# Patient Record
Sex: Female | Born: 1945 | Race: Black or African American | Hispanic: No | Smoking: Never smoker
Health system: Southern US, Community
[De-identification: ages and names within clinical notes are randomized; demographics above are authoritative.]

## PROBLEM LIST (undated history)

## (undated) DIAGNOSIS — I10 Essential (primary) hypertension: Secondary | ICD-10-CM

## (undated) DIAGNOSIS — A809 Acute poliomyelitis, unspecified: Secondary | ICD-10-CM

## (undated) DIAGNOSIS — H547 Unspecified visual loss: Secondary | ICD-10-CM

## (undated) HISTORY — PX: EYE SURGERY: SHX253

---

## 1998-01-10 ENCOUNTER — Other Ambulatory Visit: Admission: RE | Admit: 1998-01-10 | Discharge: 1998-01-10 | Payer: Self-pay | Admitting: Endocrinology

## 1999-01-15 ENCOUNTER — Other Ambulatory Visit: Admission: RE | Admit: 1999-01-15 | Discharge: 1999-01-15 | Payer: Self-pay | Admitting: Endocrinology

## 2000-02-12 ENCOUNTER — Other Ambulatory Visit: Admission: RE | Admit: 2000-02-12 | Discharge: 2000-02-12 | Payer: Self-pay | Admitting: Endocrinology

## 2001-02-23 ENCOUNTER — Other Ambulatory Visit: Admission: RE | Admit: 2001-02-23 | Discharge: 2001-02-23 | Payer: Self-pay | Admitting: Endocrinology

## 2001-03-09 ENCOUNTER — Encounter: Payer: Self-pay | Admitting: Endocrinology

## 2001-03-09 ENCOUNTER — Ambulatory Visit (HOSPITAL_COMMUNITY): Admission: RE | Admit: 2001-03-09 | Discharge: 2001-03-09 | Payer: Self-pay | Admitting: Endocrinology

## 2002-08-19 ENCOUNTER — Other Ambulatory Visit: Admission: RE | Admit: 2002-08-19 | Discharge: 2002-08-19 | Payer: Self-pay | Admitting: Endocrinology

## 2004-08-31 ENCOUNTER — Other Ambulatory Visit: Admission: RE | Admit: 2004-08-31 | Discharge: 2004-08-31 | Payer: Self-pay | Admitting: Endocrinology

## 2004-11-14 ENCOUNTER — Encounter (INDEPENDENT_AMBULATORY_CARE_PROVIDER_SITE_OTHER): Payer: Self-pay | Admitting: Specialist

## 2004-11-14 ENCOUNTER — Ambulatory Visit (HOSPITAL_COMMUNITY): Admission: RE | Admit: 2004-11-14 | Discharge: 2004-11-14 | Payer: Self-pay | Admitting: *Deleted

## 2005-01-08 ENCOUNTER — Ambulatory Visit (HOSPITAL_COMMUNITY): Admission: RE | Admit: 2005-01-08 | Discharge: 2005-01-08 | Payer: Self-pay | Admitting: Endocrinology

## 2006-02-06 ENCOUNTER — Ambulatory Visit (HOSPITAL_COMMUNITY): Admission: RE | Admit: 2006-02-06 | Discharge: 2006-02-06 | Payer: Self-pay | Admitting: Endocrinology

## 2006-11-19 ENCOUNTER — Other Ambulatory Visit: Admission: RE | Admit: 2006-11-19 | Discharge: 2006-11-19 | Payer: Self-pay | Admitting: Endocrinology

## 2007-04-20 ENCOUNTER — Ambulatory Visit (HOSPITAL_COMMUNITY): Admission: RE | Admit: 2007-04-20 | Discharge: 2007-04-20 | Payer: Self-pay | Admitting: *Deleted

## 2007-04-20 ENCOUNTER — Encounter (INDEPENDENT_AMBULATORY_CARE_PROVIDER_SITE_OTHER): Payer: Self-pay | Admitting: *Deleted

## 2008-02-09 ENCOUNTER — Other Ambulatory Visit: Admission: RE | Admit: 2008-02-09 | Discharge: 2008-02-09 | Payer: Self-pay | Admitting: Endocrinology

## 2009-04-03 ENCOUNTER — Ambulatory Visit (HOSPITAL_COMMUNITY): Admission: RE | Admit: 2009-04-03 | Discharge: 2009-04-03 | Payer: Self-pay | Admitting: Endocrinology

## 2010-07-10 NOTE — Op Note (Signed)
[name], Jaclyn Donovan               ACCOUNT NO.:  [id number]   MEDICAL RECORD NO.:  [id number]          PATIENT TYPE:  AMB   LOCATION:  ENDO                         FACILITY:  [facility]   PHYSICIAN:  Georgiana Spinner, M.D.    DATE OF BIRTH:  29-Jan-1946   DATE OF PROCEDURE:  DATE OF DISCHARGE:                               OPERATIVE REPORT   PROCEDURE:  Colonoscopy with polypectomy.   INDICATIONS:  Colon polyp.   ANESTHESIA:  Fentanyl 100 mcg, Versed 9 mg.   PROCEDURE:  With the patient mildly sedated in the left lateral  decubitus position, the Pentax videoscopic colonoscope was inserted in  the rectum and passed under direct vision with pressure applied. The  patient turned to her back and subsequently pulled back the colonoscope  and then reinserted. We subsequently were able to reach the cecum  identified by the crow's foot of the cecum and ileocecal valve both of  which were photographed. From this point, the colonoscope was slowly  withdrawn taking circumferential views of the colonic mucosa stopping  only in the ascending colon where a small polyp was seen, photographed  and removed using snare cautery technique setting of 20/150 blended  current.  Tissue was retained by suctioning through the endoscope.  The  endoscope was then withdrawn all the way to the rectum which appeared  normal on direct and showed hemorrhoids on retroflexed view. The  endoscope was straightened and withdrawn.  The patient's vital signs and  pulse oximeter remained stable.  The patient tolerated the procedure  well without apparent complications.   FINDINGS:  Polyp of ascending colon removed using snare cautery  technique.   PLAN:  Await biopsy report. The patient will call me for results and  follow-up with me as needed as an outpatient.  This was a difficult  exam.           ______________________________  Georgiana Spinner, M.D.     GMO/MEDQ  D:  04/20/2007  T:  04/20/2007  Job:  [id number]

## 2010-07-13 NOTE — Op Note (Signed)
Jaclyn Donovan, Jaclyn Donovan               ACCOUNT NO.:  [id number]   MEDICAL RECORD NO.:  [id number]          PATIENT TYPE:  AMB   LOCATION:  ENDO                         FACILITY:  [facility]   PHYSICIAN:  Georgiana Spinner, M.D.    DATE OF BIRTH:  [birth year]   DATE OF PROCEDURE:  DATE OF DISCHARGE:                                 OPERATIVE REPORT   PROCEDURE:  Colonoscopy with polypectomy.   INDICATIONS:  Colon polyp.   ANESTHESIA:  Demerol 20, Versed 1 mg.   PROCEDURE:  With the patient mildly sedated in the left lateral decubitus  position, the Olympus videoscopic colonoscope was inserted into the rectum  and passed under direct vision to the cecum, identified by the ileocecal  valve and the appendiceal orifice, both of which were photographed.  From  this point, the colonoscope was slowly withdrawn, taking circumferential  views of the colonic mucosa, stopping only at approximately 40 cm from the  anal verge, at which point a polyp was seen, photographed, and removed using  snare cautery technique.  At a setting of 20/200 blended current, tissue was  retrieved by suctioning to the end of the endoscope and withdrawing it.  The  endoscope was then reinserted to this level and withdrawn, taking  circumferential views at the remaining colonic mucosa, stopping only in the  rectum which appeared normal on direct and retroflexed view.  The scope was  straightened and withdrawn.  Patient's vital signs and pulse oximetry  remained stable.  Patient tolerated the procedure well without apparent  complications.   FINDINGS:  Polyp on a short stalk, removed by snare cautery technique,  otherwise an unremarkable colonoscopic examination.   PLAN:  Await biopsy report.  Patient will call me for results and follow up  with me as an outpatient.   FINDINGS:  Internal hemorrhoids, otherwise unremarkable examination.   PLAN:  See endoscopy note for further details.            ______________________________  Georgiana Spinner, M.D.     GMO/MEDQ  D:  11/14/2004  T:  11/14/2004  Job:  [id number]

## 2010-07-13 NOTE — Op Note (Signed)
NAMEBOBBYJO, Jaclyn Donovan               ACCOUNT NO.:  [id number]   MEDICAL RECORD NO.:  [id number]          PATIENT TYPE:  AMB   LOCATION:  ENDO                         [facility]ACILITY[facility]   PHYSICIAN:  Georgiana Spinner, M.D.    DATE OF BIRTH:  05/03/1945   DATE OF PROCEDURE:  11/14/2004  DATE OF DISCHARGE:                                 OPERATIVE REPORT   PROCEDURE:  Upper endoscopy.   INDICATIONS:  Gastroesophageal reflux disease.   ANESTHESIA:  Demerol 60 milligrams, Versed 6 milligrams.   DESCRIPTION OF PROCEDURE:  With the patient mildly sedated in the left  lateral decubitus position, the Olympus videoscopic endoscope was inserted  in the mouth, passed under direct vision through the esophagus which  appeared normal until we reached the distal esophagus and the patient was at  this point slightly light under sedation, gagged, and I could not really  tell if the squamocolumnar junction showed any areas of Barrett's because  she continued to gag, but I did photograph this area and take some sample  biopsies but I would probably want to look again if these are negative. We  then advanced into the stomach. The fundus, body, antrum, duodenal bulb, and  second portion of the duodenum were visualized. From this point, the  endoscope was slowly withdrawn taking circumferential views of the duodenal  mucosa until the endoscope had been pulled back into stomach, placed in  retroflexion to view the stomach from below. The endoscope was then  straightened and withdrawn taking circumferential views of the remaining  gastric and esophageal mucosa. The patient's vital signs and pulse oximeter  remained stable. The patient tolerated the procedure well without apparent  complications.   FINDINGS:  Question of Barrett's esophagus biopsied, await biopsy report.  The patient will call me for results and follow-up me as an outpatient.  Proceed to colonoscopy as planned.     ______________________________  Georgiana Spinner, M.D.     GMO/MEDQ  D:  11/14/2004  T:  11/14/2004  Job:  [id number]

## 2011-07-17 ENCOUNTER — Other Ambulatory Visit (HOSPITAL_COMMUNITY): Payer: Self-pay | Admitting: Endocrinology

## 2011-07-17 DIAGNOSIS — Z1231 Encounter for screening mammogram for malignant neoplasm of breast: Secondary | ICD-10-CM

## 2011-08-16 ENCOUNTER — Ambulatory Visit (HOSPITAL_COMMUNITY)
Admission: RE | Admit: 2011-08-16 | Discharge: 2011-08-16 | Disposition: A | Discharge status: Home / Self Care | Payer: Medicare Other | Source: Ambulatory Visit | Attending: Endocrinology | Admitting: Endocrinology

## 2011-08-16 DIAGNOSIS — Z1231 Encounter for screening mammogram for malignant neoplasm of breast: Secondary | ICD-10-CM | POA: Insufficient documentation

## 2012-12-01 ENCOUNTER — Other Ambulatory Visit (HOSPITAL_COMMUNITY): Payer: Self-pay | Admitting: Endocrinology

## 2012-12-01 DIAGNOSIS — Z1231 Encounter for screening mammogram for malignant neoplasm of breast: Secondary | ICD-10-CM

## 2012-12-15 ENCOUNTER — Ambulatory Visit (HOSPITAL_COMMUNITY): Payer: Medicare Other

## 2012-12-22 ENCOUNTER — Ambulatory Visit (HOSPITAL_COMMUNITY)
Admission: RE | Admit: 2012-12-22 | Discharge: 2012-12-22 | Disposition: A | Discharge status: Home / Self Care | Payer: Medicare Other | Source: Ambulatory Visit | Attending: Endocrinology | Admitting: Endocrinology

## 2012-12-22 DIAGNOSIS — Z1231 Encounter for screening mammogram for malignant neoplasm of breast: Secondary | ICD-10-CM | POA: Insufficient documentation

## 2014-03-01 ENCOUNTER — Other Ambulatory Visit (HOSPITAL_COMMUNITY): Payer: Self-pay | Admitting: Endocrinology

## 2014-03-01 DIAGNOSIS — Z1231 Encounter for screening mammogram for malignant neoplasm of breast: Secondary | ICD-10-CM

## 2014-03-08 ENCOUNTER — Ambulatory Visit (HOSPITAL_COMMUNITY)
Admission: RE | Admit: 2014-03-08 | Discharge: 2014-03-08 | Disposition: A | Discharge status: Home / Self Care | Payer: 59 | Source: Ambulatory Visit | Attending: Endocrinology | Admitting: Endocrinology

## 2014-03-08 DIAGNOSIS — Z1231 Encounter for screening mammogram for malignant neoplasm of breast: Secondary | ICD-10-CM | POA: Diagnosis not present

## 2015-02-22 ENCOUNTER — Other Ambulatory Visit: Payer: Self-pay

## 2015-02-22 DIAGNOSIS — Z1231 Encounter for screening mammogram for malignant neoplasm of breast: Secondary | ICD-10-CM

## 2015-03-20 ENCOUNTER — Ambulatory Visit
Admission: RE | Admit: 2015-03-20 | Discharge: 2015-03-20 | Disposition: A | Discharge status: Home / Self Care | Payer: Medicare Other | Source: Ambulatory Visit

## 2015-03-20 DIAGNOSIS — Z1231 Encounter for screening mammogram for malignant neoplasm of breast: Secondary | ICD-10-CM

## 2016-02-21 ENCOUNTER — Other Ambulatory Visit: Payer: Self-pay | Admitting: Endocrinology

## 2016-02-21 DIAGNOSIS — Z1231 Encounter for screening mammogram for malignant neoplasm of breast: Secondary | ICD-10-CM

## 2016-02-22 ENCOUNTER — Other Ambulatory Visit: Payer: Self-pay | Admitting: Endocrinology

## 2016-02-22 DIAGNOSIS — N644 Mastodynia: Secondary | ICD-10-CM

## 2016-03-06 ENCOUNTER — Ambulatory Visit
Admission: RE | Admit: 2016-03-06 | Discharge: 2016-03-06 | Disposition: A | Discharge status: Home / Self Care | Payer: Medicare Other | Source: Ambulatory Visit | Attending: Endocrinology | Admitting: Endocrinology

## 2016-03-06 ENCOUNTER — Other Ambulatory Visit: Payer: Self-pay | Admitting: Endocrinology

## 2016-03-06 DIAGNOSIS — N644 Mastodynia: Secondary | ICD-10-CM

## 2016-04-15 ENCOUNTER — Ambulatory Visit
Admission: RE | Admit: 2016-04-15 | Discharge: 2016-04-15 | Disposition: A | Discharge status: Home / Self Care | Payer: Medicare Other | Source: Ambulatory Visit | Attending: Endocrinology | Admitting: Endocrinology

## 2016-04-15 DIAGNOSIS — Z1231 Encounter for screening mammogram for malignant neoplasm of breast: Secondary | ICD-10-CM

## 2016-08-03 ENCOUNTER — Encounter (HOSPITAL_COMMUNITY): Payer: Self-pay | Admitting: Emergency Medicine

## 2016-08-03 ENCOUNTER — Emergency Department (HOSPITAL_COMMUNITY)
Admission: EM | Admit: 2016-08-03 | Discharge: 2016-08-03 | Disposition: A | Discharge status: Home / Self Care | Payer: Medicare Other | Attending: Emergency Medicine | Admitting: Emergency Medicine

## 2016-08-03 ENCOUNTER — Emergency Department (HOSPITAL_BASED_OUTPATIENT_CLINIC_OR_DEPARTMENT_OTHER)
Admit: 2016-08-03 | Discharge: 2016-08-03 | Disposition: A | Discharge status: Home / Self Care | Payer: Medicare Other | Attending: Emergency Medicine | Admitting: Emergency Medicine

## 2016-08-03 DIAGNOSIS — I10 Essential (primary) hypertension: Secondary | ICD-10-CM | POA: Insufficient documentation

## 2016-08-03 DIAGNOSIS — R2241 Localized swelling, mass and lump, right lower limb: Secondary | ICD-10-CM | POA: Diagnosis present

## 2016-08-03 DIAGNOSIS — M7989 Other specified soft tissue disorders: Secondary | ICD-10-CM | POA: Diagnosis not present

## 2016-08-03 DIAGNOSIS — I890 Lymphedema, not elsewhere classified: Secondary | ICD-10-CM | POA: Insufficient documentation

## 2016-08-03 DIAGNOSIS — M79609 Pain in unspecified limb: Secondary | ICD-10-CM

## 2016-08-03 HISTORY — DX: Unspecified visual loss: H54.7

## 2016-08-03 HISTORY — DX: Acute poliomyelitis, unspecified: A80.9

## 2016-08-03 HISTORY — DX: Essential (primary) hypertension: I10

## 2016-08-03 LAB — BASIC METABOLIC PANEL
Anion gap: 8 (ref 5–15)
BUN: 12 mg/dL (ref 6–20)
CALCIUM: 9.2 mg/dL (ref 8.9–10.3)
CO2: 27 mmol/L (ref 22–32)
Chloride: 100 mmol/L — ABNORMAL LOW (ref 101–111)
Creatinine, Ser: 0.95 mg/dL (ref 0.44–1.00)
GFR calc Af Amer: >60 mL/min (ref >60)
GFR, EST NON AFRICAN AMERICAN: 59 mL/min — AB (ref >60)
GLUCOSE: 110 mg/dL — AB (ref 65–99)
Potassium: 3.7 mmol/L (ref 3.5–5.1)
Sodium: 135 mmol/L (ref 135–145)

## 2016-08-03 LAB — CBC
HEMATOCRIT: 37.6 % (ref 36.0–46.0)
HEMOGLOBIN: 12.2 g/dL (ref 12.0–15.0)
MCH: 30.3 pg (ref 26.0–34.0)
MCHC: 32.4 g/dL (ref 30.0–36.0)
MCV: 93.3 fL (ref 78.0–100.0)
Platelets: 232 10*3/uL (ref 150–400)
RBC: 4.03 MIL/uL (ref 3.87–5.11)
RDW: 13.3 % (ref 11.5–15.5)
WBC: 3.3 10*3/uL — ABNORMAL LOW (ref 4.0–10.5)

## 2016-08-03 LAB — I-STAT TROPONIN, ED: TROPONIN I, POC: 0 ng/mL (ref 0.00–0.08)

## 2016-08-03 NOTE — Discharge Instructions (Signed)
Elevate your leg above your heart as much as possible. Get your blood pressure rechecked by your doctor within the next one or 2 weeks. Today's was elevated at 171/77. You can call any of the numbers on these discharge instructions to get a new primary care doctor, or call to doctor of your choice. If you can't get a new primary care doctor within the next one or 2 weeks, you can get your blood pressure rechecked at an urgent care center

## 2016-08-03 NOTE — ED Provider Notes (Signed)
[facility]-EMERGENCY DEPT Provider Note   CSN: [id number] Arrival date & time: 08/03/16  1051     History   Chief Complaint Chief Complaint  Patient presents with  . Leg Swelling    HPI Jaclyn Donovan is a 71 y.o. female.Plains of right leg swelling for the past 3 weeks. Denies leg pain denies fever denies injury. Denies any shortness of breath in the past 3 weeks. Denies chest pain. No other associated symptoms. [facility] sent here for further further evaluation to check for DVT. No treatment prior to coming here. Swelling is worse at the end of the day after she's been on her feet improved to the beginning of the day. She states her leg swelling is improved since being here  HPI  Past Medical History:  Diagnosis Date  . Blind   . Hypertension   . Polio     There are no active problems to display for this patient.   Past Surgical History:  Procedure Laterality Date  . EYE SURGERY      OB History    No data available       Home Medications    Prior to Admission medications   Not on File    Family History No family history on file.  Social History Social History  Substance Use Topics  . Smoking status: Never Smoker  . Smokeless tobacco: Never Used  . Alcohol use No     Allergies   Patient has no known allergies.   Review of Systems Review of Systems  Constitutional: Negative.   HENT: Negative.   Respiratory: Negative.   Cardiovascular: Positive for leg swelling.  Gastrointestinal: Negative.   Musculoskeletal: Positive for gait problem.       Walks with lymph favoring right leg  Skin: Negative.   Psychiatric/Behavioral: Negative.   All other systems reviewed and are negative.    Physical Exam Updated Vital Signs BP (!) 158/79   Pulse 65   Temp 97.9 F (36.6 C) (Oral)   Resp 16   Ht 5\' 3"  (1.6 m)   Wt 83.9 kg (185 lb)   SpO2 96%   BMI 32.77 kg/m   Physical Exam  Constitutional: She appears  well-developed and well-nourished.  HENT:  Head: Normocephalic and atraumatic.  Eyes: EOM are normal.  Neck: Neck supple. No tracheal deviation present. No thyromegaly present.  Cardiovascular: Normal rate and regular rhythm.   No murmur heard. Pulmonary/Chest: Effort normal and breath sounds normal.  Abdominal: Soft. Bowel sounds are normal. She exhibits no distension. There is no tenderness.  Musculoskeletal: Normal range of motion. She exhibits edema. She exhibits no tenderness.  Right lower extremity with pretibial and pedal edema, 2+, nonpitting. Not red warm or tender. DP pulse 2+ good capillary refill All other extremities without redness or tenderness neurovascular intact  Neurological: She is alert. Coordination normal.  Skin: Skin is warm and dry. No rash noted.  Psychiatric: She has a normal mood and affect.  Nursing note and vitals reviewed.    ED Treatments / Results  Labs (all labs ordered are listed, but only abnormal results are displayed) Labs Reviewed  BASIC METABOLIC PANEL - Abnormal; Notable for the following:       Result Value   Chloride 100 (*)    Glucose, Bld 110 (*)    GFR calc non Af Amer 59 (*)    All other components within normal limits  CBC - Abnormal; Notable for the following:  WBC 3.3 (*)    All other components within normal limits  I-STAT TROPOININ, ED    EKG  EKG Interpretation  Date/Time:  Saturday August 03 2016 12:09:42 EDT Ventricular Rate:  68 PR Interval:    QRS Duration: 69 QT Interval:  403 QTC Calculation: 429 R Axis:   46 Text Interpretation:  Sinus rhythm Nonspecific T wave abnormality No old tracing to compare Confirmed by TerryvilleJacubowitz, Doreatha MartinSam [phone]([id number]) on 08/03/2016 1:08:23 PM       Radiology No results found.  Procedures Procedures (including critical care time)  Medications Ordered in ED Medications - No data to display   Initial Impression / Assessment and Plan / ED Course  I have reviewed the triage vital signs  and the nursing notes.  Pertinent labs & imaging results that were available during my care of the patient were reviewed by me and considered in my medical decision making (see chart for details).     2:55 PM patient asymptomatic. Resting comfortably. Plan follow-up with PMD. Elevate leg. Blood pressure recheck one to 2 weeks  Final Clinical Impressions(s) / ED Diagnoses   #1 lymphedema right leg #2 elevated blood pressure Final diagnoses:  None    New Prescriptions New Prescriptions   No medications on file     Doug SouJacubowitz, Dania Marsan, MD 08/03/16 1510

## 2016-08-03 NOTE — Progress Notes (Signed)
VASCULAR LAB PRELIMINARY  PRELIMINARY  PRELIMINARY  PRELIMINARY  Right lower extremity venous duplex completed.    Preliminary report:  There is no DVT or SVT noted in the right lower extremity.  Gave report to Dr. Orson EvaJacubowitz  Deneka Greenwalt, [facility] Medical CenterCANDACE, RVT 08/03/2016, 2:58 PM

## 2016-08-03 NOTE — ED Triage Notes (Signed)
[facility]/o right leg swelling onset 3 weeks ago. Pt reports some shortness of breath at times.

## 2018-01-06 ENCOUNTER — Other Ambulatory Visit: Payer: Self-pay | Admitting: Internal Medicine

## 2018-01-06 DIAGNOSIS — Z1231 Encounter for screening mammogram for malignant neoplasm of breast: Secondary | ICD-10-CM

## 2018-02-17 ENCOUNTER — Ambulatory Visit
Admission: RE | Admit: 2018-02-17 | Discharge: 2018-02-17 | Disposition: A | Discharge status: Home / Self Care | Payer: Medicare Other | Source: Ambulatory Visit | Attending: Internal Medicine | Admitting: Internal Medicine

## 2018-02-17 DIAGNOSIS — Z1231 Encounter for screening mammogram for malignant neoplasm of breast: Secondary | ICD-10-CM

## 2018-03-18 DIAGNOSIS — K648 Other hemorrhoids: Secondary | ICD-10-CM | POA: Diagnosis not present

## 2018-04-02 DIAGNOSIS — J069 Acute upper respiratory infection, unspecified: Secondary | ICD-10-CM | POA: Diagnosis not present

## 2018-04-02 DIAGNOSIS — Z23 Encounter for immunization: Secondary | ICD-10-CM | POA: Diagnosis not present

## 2018-04-22 DIAGNOSIS — I10 Essential (primary) hypertension: Secondary | ICD-10-CM | POA: Diagnosis not present

## 2018-04-22 DIAGNOSIS — R062 Wheezing: Secondary | ICD-10-CM | POA: Diagnosis not present

## 2018-04-30 DIAGNOSIS — I10 Essential (primary) hypertension: Secondary | ICD-10-CM | POA: Diagnosis not present

## 2018-04-30 DIAGNOSIS — R062 Wheezing: Secondary | ICD-10-CM | POA: Diagnosis not present

## 2018-05-06 DIAGNOSIS — K648 Other hemorrhoids: Secondary | ICD-10-CM | POA: Diagnosis not present

## 2018-05-06 DIAGNOSIS — K644 Residual hemorrhoidal skin tags: Secondary | ICD-10-CM | POA: Diagnosis not present

## 2018-07-07 DIAGNOSIS — R062 Wheezing: Secondary | ICD-10-CM | POA: Diagnosis not present

## 2018-07-07 DIAGNOSIS — I10 Essential (primary) hypertension: Secondary | ICD-10-CM | POA: Diagnosis not present

## 2018-07-08 DIAGNOSIS — K648 Other hemorrhoids: Secondary | ICD-10-CM | POA: Diagnosis not present

## 2018-07-08 DIAGNOSIS — K644 Residual hemorrhoidal skin tags: Secondary | ICD-10-CM | POA: Diagnosis not present

## 2018-07-31 DIAGNOSIS — Z7189 Other specified counseling: Secondary | ICD-10-CM | POA: Diagnosis not present

## 2018-10-13 DIAGNOSIS — I10 Essential (primary) hypertension: Secondary | ICD-10-CM | POA: Diagnosis not present

## 2018-10-13 DIAGNOSIS — R05 Cough: Secondary | ICD-10-CM | POA: Diagnosis not present

## 2018-12-17 ENCOUNTER — Other Ambulatory Visit: Payer: Self-pay | Admitting: Internal Medicine

## 2018-12-17 DIAGNOSIS — Z1231 Encounter for screening mammogram for malignant neoplasm of breast: Secondary | ICD-10-CM

## 2018-12-30 DIAGNOSIS — N644 Mastodynia: Secondary | ICD-10-CM | POA: Diagnosis not present

## 2019-01-19 DIAGNOSIS — I10 Essential (primary) hypertension: Secondary | ICD-10-CM | POA: Diagnosis not present

## 2019-01-28 DIAGNOSIS — R300 Dysuria: Secondary | ICD-10-CM | POA: Diagnosis not present

## 2019-01-28 DIAGNOSIS — N390 Urinary tract infection, site not specified: Secondary | ICD-10-CM | POA: Diagnosis not present

## 2019-02-22 ENCOUNTER — Other Ambulatory Visit: Payer: Self-pay

## 2019-02-22 ENCOUNTER — Ambulatory Visit
Admission: RE | Admit: 2019-02-22 | Discharge: 2019-02-22 | Disposition: A | Discharge status: Home / Self Care | Payer: Medicare Other | Source: Ambulatory Visit | Attending: Internal Medicine | Admitting: Internal Medicine

## 2019-02-22 DIAGNOSIS — Z1231 Encounter for screening mammogram for malignant neoplasm of breast: Secondary | ICD-10-CM

## 2019-03-02 DIAGNOSIS — I10 Essential (primary) hypertension: Secondary | ICD-10-CM | POA: Diagnosis not present

## 2019-03-02 DIAGNOSIS — H547 Unspecified visual loss: Secondary | ICD-10-CM | POA: Diagnosis not present

## 2019-03-17 DIAGNOSIS — K648 Other hemorrhoids: Secondary | ICD-10-CM | POA: Diagnosis not present

## 2019-04-28 DIAGNOSIS — K648 Other hemorrhoids: Secondary | ICD-10-CM | POA: Diagnosis not present

## 2019-05-10 ENCOUNTER — Other Ambulatory Visit: Payer: Self-pay

## 2019-05-10 ENCOUNTER — Encounter (HOSPITAL_COMMUNITY): Payer: Self-pay

## 2019-05-10 ENCOUNTER — Emergency Department (HOSPITAL_COMMUNITY)
Admission: EM | Admit: 2019-05-10 | Discharge: 2019-05-10 | Disposition: A | Discharge status: Home / Self Care | Payer: Medicare Other | Attending: Emergency Medicine | Admitting: Emergency Medicine

## 2019-05-10 DIAGNOSIS — Z743 Need for continuous supervision: Secondary | ICD-10-CM | POA: Diagnosis not present

## 2019-05-10 DIAGNOSIS — R410 Disorientation, unspecified: Secondary | ICD-10-CM | POA: Diagnosis not present

## 2019-05-10 DIAGNOSIS — R55 Syncope and collapse: Secondary | ICD-10-CM | POA: Diagnosis not present

## 2019-05-10 DIAGNOSIS — Z5321 Procedure and treatment not carried out due to patient leaving prior to being seen by health care provider: Secondary | ICD-10-CM | POA: Insufficient documentation

## 2019-05-10 LAB — BASIC METABOLIC PANEL
Anion gap: 11 (ref 5–15)
BUN: 22 mg/dL (ref 8–23)
CO2: 26 mmol/L (ref 22–32)
Calcium: 9.1 mg/dL (ref 8.9–10.3)
Chloride: 99 mmol/L (ref 98–111)
Creatinine, Ser: 1.27 mg/dL — ABNORMAL HIGH (ref 0.44–1.00)
GFR calc Af Amer: 48 mL/min — ABNORMAL LOW (ref >60)
GFR calc non Af Amer: 42 mL/min — ABNORMAL LOW (ref >60)
Glucose, Bld: 108 mg/dL — ABNORMAL HIGH (ref 70–99)
Potassium: 3.9 mmol/L (ref 3.5–5.1)
Sodium: 136 mmol/L (ref 135–145)

## 2019-05-10 LAB — CBC
HCT: 37.5 % (ref 36.0–46.0)
Hemoglobin: 12.2 g/dL (ref 12.0–15.0)
MCH: 30.9 pg (ref 26.0–34.0)
MCHC: 32.5 g/dL (ref 30.0–36.0)
MCV: 94.9 fL (ref 80.0–100.0)
Platelets: 252 10*3/uL (ref 150–400)
RBC: 3.95 MIL/uL (ref 3.87–5.11)
RDW: 13.4 % (ref 11.5–15.5)
WBC: 6.2 10*3/uL (ref 4.0–10.5)
nRBC: 0 % (ref 0.0–0.2)

## 2019-05-10 MED ORDER — SODIUM CHLORIDE 0.9% FLUSH: 3.0000 mL | Freq: Once | INTRAVENOUS | Status: DC

## 2019-05-10 NOTE — ED Triage Notes (Signed)
Per daughter she found pt "passed out" on the bed this am, unable to wake her up, EMS came to assess pt, pt declined to come in at that time. Pt received 2nd covid vaccine on Friday

## 2019-05-10 NOTE — ED Notes (Addendum)
Family statiing that she would like to leave. Has Dr. With her DR. Next week, and would see here Dr. I informed her that, if she needed to come back, please do so.  Informed her family that we would see he asap, but were very busy.

## 2019-05-12 DIAGNOSIS — K648 Other hemorrhoids: Secondary | ICD-10-CM | POA: Diagnosis not present

## 2019-05-13 ENCOUNTER — Other Ambulatory Visit: Payer: Self-pay

## 2019-05-13 DIAGNOSIS — Z0000 Encounter for general adult medical examination without abnormal findings: Secondary | ICD-10-CM | POA: Diagnosis not present

## 2019-05-13 DIAGNOSIS — Z79899 Other long term (current) drug therapy: Secondary | ICD-10-CM | POA: Diagnosis not present

## 2019-05-13 DIAGNOSIS — I10 Essential (primary) hypertension: Secondary | ICD-10-CM | POA: Diagnosis not present

## 2019-05-13 NOTE — Patient Outreach (Signed)
[facility]) Care Management  05/13/2019  Jaclyn Donovan April 26, 1945 [id number]   Medication Adherence call to Jaclyn Donovan Hippa Identifiers Verify spoke with patient she is past due on Valsartan/Hctz 320/25 mg,spoke with patients daughter she explain patient takes 1 tablet daily,but she thinks she has not been taking it on a regular basis,she explain she will be looking in to a pill box,patient has seven more days,patient will order when finished. Jaclyn Donovan is showing past due under [place] [employer].  Lillia Abed CPhT Pharmacy Technician [facility] Care Management Direct Dial [phone]  Fax [phone] [email or web address]

## 2019-05-20 DIAGNOSIS — I10 Essential (primary) hypertension: Secondary | ICD-10-CM | POA: Diagnosis not present

## 2019-05-20 DIAGNOSIS — Z0000 Encounter for general adult medical examination without abnormal findings: Secondary | ICD-10-CM | POA: Diagnosis not present

## 2019-05-20 DIAGNOSIS — H547 Unspecified visual loss: Secondary | ICD-10-CM | POA: Diagnosis not present

## 2019-05-20 DIAGNOSIS — R55 Syncope and collapse: Secondary | ICD-10-CM | POA: Diagnosis not present

## 2019-05-26 DIAGNOSIS — K648 Other hemorrhoids: Secondary | ICD-10-CM | POA: Diagnosis not present

## 2019-06-21 ENCOUNTER — Other Ambulatory Visit: Payer: Self-pay | Admitting: Family Medicine

## 2019-06-21 DIAGNOSIS — R109 Unspecified abdominal pain: Secondary | ICD-10-CM

## 2019-07-05 ENCOUNTER — Ambulatory Visit
Admission: RE | Admit: 2019-07-05 | Discharge: 2019-07-05 | Disposition: A | Discharge status: Home / Self Care | Payer: Medicare Other | Source: Ambulatory Visit | Attending: Family Medicine | Admitting: Family Medicine

## 2019-07-05 DIAGNOSIS — R109 Unspecified abdominal pain: Secondary | ICD-10-CM

## 2019-07-05 DIAGNOSIS — N200 Calculus of kidney: Secondary | ICD-10-CM | POA: Diagnosis not present

## 2019-11-15 DIAGNOSIS — I10 Essential (primary) hypertension: Secondary | ICD-10-CM | POA: Diagnosis not present

## 2019-11-22 DIAGNOSIS — N200 Calculus of kidney: Secondary | ICD-10-CM | POA: Diagnosis not present

## 2019-11-22 DIAGNOSIS — Z0000 Encounter for general adult medical examination without abnormal findings: Secondary | ICD-10-CM | POA: Diagnosis not present

## 2019-11-22 DIAGNOSIS — I10 Essential (primary) hypertension: Secondary | ICD-10-CM | POA: Diagnosis not present

## 2020-02-03 DIAGNOSIS — G3184 Mild cognitive impairment, so stated: Secondary | ICD-10-CM | POA: Diagnosis not present

## 2020-02-03 DIAGNOSIS — Z880 Allergy status to penicillin: Secondary | ICD-10-CM | POA: Diagnosis not present

## 2020-03-04 DIAGNOSIS — R413 Other amnesia: Secondary | ICD-10-CM | POA: Diagnosis not present

## 2020-03-04 DIAGNOSIS — G9389 Other specified disorders of brain: Secondary | ICD-10-CM | POA: Diagnosis not present

## 2020-03-04 DIAGNOSIS — I739 Peripheral vascular disease, unspecified: Secondary | ICD-10-CM | POA: Diagnosis not present

## 2020-03-04 DIAGNOSIS — I6789 Other cerebrovascular disease: Secondary | ICD-10-CM | POA: Diagnosis not present

## 2020-06-28 DIAGNOSIS — R7989 Other specified abnormal findings of blood chemistry: Secondary | ICD-10-CM | POA: Diagnosis not present

## 2020-06-28 DIAGNOSIS — I10 Essential (primary) hypertension: Secondary | ICD-10-CM | POA: Diagnosis not present

## 2020-06-28 DIAGNOSIS — R7309 Other abnormal glucose: Secondary | ICD-10-CM | POA: Diagnosis not present

## 2020-07-05 DIAGNOSIS — I10 Essential (primary) hypertension: Secondary | ICD-10-CM | POA: Diagnosis not present

## 2020-07-05 DIAGNOSIS — H547 Unspecified visual loss: Secondary | ICD-10-CM | POA: Diagnosis not present

## 2020-07-05 DIAGNOSIS — Z0000 Encounter for general adult medical examination without abnormal findings: Secondary | ICD-10-CM | POA: Diagnosis not present

## 2020-07-05 DIAGNOSIS — H6120 Impacted cerumen, unspecified ear: Secondary | ICD-10-CM | POA: Diagnosis not present

## 2020-07-05 DIAGNOSIS — R7303 Prediabetes: Secondary | ICD-10-CM | POA: Diagnosis not present

## 2020-09-01 ENCOUNTER — Other Ambulatory Visit: Payer: Self-pay

## 2020-09-01 ENCOUNTER — Emergency Department (HOSPITAL_BASED_OUTPATIENT_CLINIC_OR_DEPARTMENT_OTHER): Payer: Medicare Other

## 2020-09-01 ENCOUNTER — Encounter (HOSPITAL_BASED_OUTPATIENT_CLINIC_OR_DEPARTMENT_OTHER): Payer: Self-pay | Admitting: *Deleted

## 2020-09-01 ENCOUNTER — Emergency Department (HOSPITAL_BASED_OUTPATIENT_CLINIC_OR_DEPARTMENT_OTHER)
Admission: EM | Admit: 2020-09-01 | Discharge: 2020-09-01 | Disposition: A | Discharge status: Home / Self Care | Payer: Medicare Other | Attending: Emergency Medicine | Admitting: Emergency Medicine

## 2020-09-01 DIAGNOSIS — Z20822 Contact with and (suspected) exposure to covid-19: Secondary | ICD-10-CM | POA: Diagnosis not present

## 2020-09-01 DIAGNOSIS — N390 Urinary tract infection, site not specified: Secondary | ICD-10-CM | POA: Diagnosis not present

## 2020-09-01 DIAGNOSIS — J9811 Atelectasis: Secondary | ICD-10-CM | POA: Diagnosis not present

## 2020-09-01 DIAGNOSIS — R55 Syncope and collapse: Secondary | ICD-10-CM | POA: Insufficient documentation

## 2020-09-01 DIAGNOSIS — I10 Essential (primary) hypertension: Secondary | ICD-10-CM | POA: Diagnosis not present

## 2020-09-01 DIAGNOSIS — Z79899 Other long term (current) drug therapy: Secondary | ICD-10-CM | POA: Diagnosis not present

## 2020-09-01 DIAGNOSIS — Z743 Need for continuous supervision: Secondary | ICD-10-CM | POA: Diagnosis not present

## 2020-09-01 DIAGNOSIS — R404 Transient alteration of awareness: Secondary | ICD-10-CM | POA: Diagnosis not present

## 2020-09-01 LAB — CBC WITH DIFFERENTIAL/PLATELET
Abs Immature Granulocytes: 0.06 10*3/uL (ref 0.00–0.07)
Basophils Absolute: 0 10*3/uL (ref 0.0–0.1)
Basophils Relative: 0 %
Eosinophils Absolute: 0 10*3/uL (ref 0.0–0.5)
Eosinophils Relative: 1 %
HCT: 39.4 % (ref 36.0–46.0)
Hemoglobin: 13.1 g/dL (ref 12.0–15.0)
Immature Granulocytes: 1 %
Lymphocytes Relative: 18 %
Lymphs Abs: 1.2 10*3/uL (ref 0.7–4.0)
MCH: 30.4 pg (ref 26.0–34.0)
MCHC: 33.2 g/dL (ref 30.0–36.0)
MCV: 91.4 fL (ref 80.0–100.0)
Monocytes Absolute: 0.5 10*3/uL (ref 0.1–1.0)
Monocytes Relative: 8 %
Neutro Abs: 4.9 10*3/uL (ref 1.7–7.7)
Neutrophils Relative %: 72 %
Platelets: 265 10*3/uL (ref 150–400)
RBC: 4.31 MIL/uL (ref 3.87–5.11)
RDW: 13.3 % (ref 11.5–15.5)
WBC: 6.8 10*3/uL (ref 4.0–10.5)
nRBC: 0 % (ref 0.0–0.2)

## 2020-09-01 LAB — URINALYSIS, MICROSCOPIC (REFLEX)

## 2020-09-01 LAB — RESP PANEL BY RT-PCR (FLU A&B, COVID) ARPGX2
Influenza A by PCR: NEGATIVE
Influenza B by PCR: NEGATIVE
SARS Coronavirus 2 by RT PCR: NEGATIVE

## 2020-09-01 LAB — BASIC METABOLIC PANEL
Anion gap: 9 (ref 5–15)
BUN: 23 mg/dL (ref 8–23)
CO2: 29 mmol/L (ref 22–32)
Calcium: 9.4 mg/dL (ref 8.9–10.3)
Chloride: 98 mmol/L (ref 98–111)
Creatinine, Ser: 1.36 mg/dL — ABNORMAL HIGH (ref 0.44–1.00)
GFR, Estimated: 41 mL/min — ABNORMAL LOW (ref >60)
Glucose, Bld: 106 mg/dL — ABNORMAL HIGH (ref 70–99)
Potassium: 4.3 mmol/L (ref 3.5–5.1)
Sodium: 136 mmol/L (ref 135–145)

## 2020-09-01 LAB — URINALYSIS, ROUTINE W REFLEX MICROSCOPIC
Bilirubin Urine: NEGATIVE
Glucose, UA: NEGATIVE mg/dL
Hgb urine dipstick: NEGATIVE
Ketones, ur: NEGATIVE mg/dL
Nitrite: NEGATIVE
Protein, ur: NEGATIVE mg/dL
Specific Gravity, Urine: 1.02 (ref 1.005–1.030)
pH: 5.5 (ref 5.0–8.0)

## 2020-09-01 LAB — RAPID URINE DRUG SCREEN, HOSP PERFORMED
Amphetamines: NOT DETECTED
Barbiturates: NOT DETECTED
Benzodiazepines: NOT DETECTED
Cocaine: NOT DETECTED
Opiates: NOT DETECTED
Tetrahydrocannabinol: NOT DETECTED

## 2020-09-01 LAB — TROPONIN I (HIGH SENSITIVITY)
Troponin I (High Sensitivity): 3 ng/L (ref <18)
Troponin I (High Sensitivity): 3 ng/L (ref <18)

## 2020-09-01 LAB — MAGNESIUM: Magnesium: 2.1 mg/dL (ref 1.7–2.4)

## 2020-09-01 MED ORDER — CEPHALEXIN 500 MG PO CAPS: 500.0000 mg | ORAL_CAPSULE | Freq: Two times a day (BID) | ORAL | 0 refills | Status: AC

## 2020-09-01 NOTE — ED Notes (Signed)
Patient transported to CT 

## 2020-09-01 NOTE — ED Provider Notes (Signed)
I received pt in signout from Dr. Renaye Rakers. Briefly, pt w/ near syncopal episode, w/u thus far reassuring. UA suggestive of UTI, plan to treat w/ keflex. At time of signout, awaiting 2nd trop.  Repeat trop is normal. Pt well appearing on reassessment. Discussed UTI treatment w/ daughter and reviewed return precautions.   Janiylah Hannis, Ambrose Finland, MD 09/01/20 1740

## 2020-09-01 NOTE — ED Triage Notes (Signed)
She passed out at the beauty salon this am. She was incontinent of urine. She is ambulatory. Alert and oriented on arrival to the ER.

## 2020-09-01 NOTE — ED Notes (Signed)
ED Provider at bedside. 

## 2020-09-01 NOTE — ED Provider Notes (Signed)
[facility] EMERGENCY DEPARTMENT Provider Note   CSN: [id number] Arrival date & time: 09/01/20  1153     History Chief Complaint  Patient presents with   Loss of Consciousness    Jaclyn Donovan is a 75 y.o. female with a history of hypertension presenting to emergency department with episode of syncope.  The patient reports that she was at the hair salon today to get her hair done.  She reports that she then had a loss of consciousness.  She denies any prodrome that she can recall.  EMS reported that she was awake and alert upon their arrival.  There is no report of tonic-clonic jerking.  However she did urinate on herself.  The patient is here with her daughter, reports the patient had a similar episode a few months ago, 4 days after her second dose of the COVID-vaccine.  She was in her room getting changed and then passed out onto the bed.  The patient's daughter came by few minutes later and found her.  It took her about 10 minutes to recover from that.  There was no incontinence at the time.  Otherwise the patient has no history of seizures.  She denies striking her head or having a headache today.  She is not on blood thinners.  She has no history of MI, cardiac disease, arrhythmia.  She has been feeling in her usual state of health and well earlier this morning.  She did eat breakfast.  She is currently asymptomatic.  She denies headache, lightheadedness, chest pain, palpitations, shortness of breath.  HPI     Past Medical History:  Diagnosis Date   Blind    Hypertension    Polio     There are no problems to display for this patient.   Past Surgical History:  Procedure Laterality Date   EYE SURGERY       OB History   No obstetric history on file.     No family history on file.  Social History   Tobacco Use   Smoking status: Never   Smokeless tobacco: Never  Vaping Use   Vaping Use: Never used  Substance Use Topics   Alcohol use: No   Drug use:  No    Home Medications Prior to Admission medications   Medication Sig Start Date End Date Taking? Authorizing Provider  amLODipine (NORVASC) 10 MG tablet  04/15/20  Yes [provider]  BYSTOLIC 10 MG tablet Take 10 mg by mouth daily. 07/07/16  Yes [provider]  hydrALAZINE (APRESOLINE) 10 MG tablet  04/24/20  Yes [provider]  valsartan-hydrochlorothiazide (DIOVAN-HCT) 320-25 MG tablet  04/24/20  Yes [provider]  acetaminophen (TYLENOL) 325 MG tablet Take 325-650 mg by mouth every 6 (six) hours as needed for mild pain.    [provider]  Homeopathic Products [facility] ALLERGY EYE RELIEF) SOLN Place 1-2 drops into both eyes 4 (four) times daily as needed (for dryness).    [provider]  latanoprost (XALATAN) 0.005 % ophthalmic solution Place 1 drop into both eyes at bedtime.    [provider]  losartan-hydrochlorothiazide (HYZAAR) 50-12.5 MG tablet Take 1 tablet by mouth 2 (two) times daily. 07/07/16   [provider]  Verapamil HCl CR 300 MG CP24 Take 300 mg by mouth daily. 07/07/16   [provider]    Allergies    Patient has no known allergies.  Review of Systems   Review of Systems  Constitutional:  Negative  for chills and fever.  HENT:  Negative for ear pain and sore throat.   Eyes:  Negative for pain and visual disturbance.  Respiratory:  Negative for cough and shortness of breath.   Cardiovascular:  Negative for chest pain and palpitations.  Gastrointestinal:  Negative for abdominal pain and vomiting.  Genitourinary:  Negative for dysuria and hematuria.  Musculoskeletal:  Negative for arthralgias and back pain.  Skin:  Negative for color change and rash.  Neurological:  Positive for syncope. Negative for dizziness, seizures, facial asymmetry, light-headedness, numbness and headaches.  All other systems reviewed and are negative.  Physical Exam Updated Vital Signs BP 123/65 (BP  Location: Right Arm)   Pulse 80   Temp 98.4 F (36.9 C) (Oral)   Resp 16   Ht 5\' 3"  (1.6 m)   Wt 81.6 kg   SpO2 97%   BMI 31.89 kg/m   Physical Exam Constitutional:      General: She is not in acute distress. HENT:     Head: Normocephalic and atraumatic.  Eyes:     Conjunctiva/sclera: Conjunctivae normal.     Pupils: Pupils are equal, round, and reactive to light.  Cardiovascular:     Rate and Rhythm: Normal rate and regular rhythm.     Pulses: Normal pulses.  Pulmonary:     Effort: Pulmonary effort is normal. No respiratory distress.  Abdominal:     General: There is no distension.     Tenderness: There is no abdominal tenderness.  Skin:    General: Skin is warm and dry.  Neurological:     General: No focal deficit present.     Mental Status: She is alert and oriented to person, place, and time. Mental status is at baseline.     Sensory: No sensory deficit.     Motor: No weakness.  Psychiatric:        Mood and Affect: Mood normal.        Behavior: Behavior normal.    ED Results / Procedures / Treatments   Labs (all labs ordered are listed, but only abnormal results are displayed) Labs Reviewed  BASIC METABOLIC PANEL  CBC WITH DIFFERENTIAL/PLATELET  URINALYSIS, ROUTINE W REFLEX MICROSCOPIC  RAPID URINE DRUG SCREEN, HOSP PERFORMED  MAGNESIUM  TROPONIN I (HIGH SENSITIVITY)    EKG EKG Interpretation  Date/Time:  Friday September 01 2020 12:02:57 EDT Ventricular Rate:  74 PR Interval:  166 QRS Duration: 64 QT Interval:  382 QTC Calculation: 424 R Axis:   6 Text Interpretation: Normal sinus rhythm with sinus arrhythmia Minimal voltage criteria for LVH, may be normal variant ( R in aVL ) Borderline ECG Confirmed by 09-27-2002 [phone]) on 09/01/2020 1:14:03 PM  Radiology No results found.  Procedures Procedures   Medications Ordered in ED Medications - No data to display  ED Course  I have reviewed the triage vital signs and the nursing  notes.  Pertinent labs & imaging results that were available during my care of the patient were reviewed by me and considered in my medical decision making (see chart for details).  Syncope vs near syncope  Ddx includes arrhythmia vs orthostatic hypotension vs dehydration vs infection vs other  No signs or symptoms of PE - doubt this as a cause No signs or symptoms of stroke  Workup reviewed- CTH without acute findings, Trops flat with ECG showing NSR,   No evidence of dehydration, no acute anemia, Covid negative.    DG chest mild atelectasis but no respiratory  sx or fever to suggest PNA.  On repeat reassessments she remained asymptomatic, stable on the monitor.  I advised to her and her daughter that they should f/u with cardiology.  This may have been a vasovagal episode today.  Referral placed to cards.  They verbalized understanding of this plan.  UA pending at time of signout to EDP Dr Clarene Duke.  Anticipating discharge home following results.         Final Clinical Impression(s) / ED Diagnoses Final diagnoses:  None    Rx / DC Orders ED Discharge Orders     None        Cola Highfill, Kermit Balo, MD 09/01/20 1719

## 2020-09-01 NOTE — ED Notes (Signed)
Pt. Denies having pacemaker or defibrillator.

## 2020-09-03 LAB — URINE CULTURE: Culture: 50000 — AB

## 2020-09-04 ENCOUNTER — Telehealth: Payer: Self-pay | Admitting: *Deleted

## 2020-09-04 NOTE — Telephone Encounter (Signed)
Post ED Visit - Positive Culture Follow-up  Culture report reviewed by antimicrobial stewardship pharmacist: [facility] []  , Pharm.D. []  Enzo Bi, .D., BCPS AQ-ID []  Celedonio Miyamoto, Pharm.D., BCPS []  [address], Pharm.D., BCPS []  [place], Garvin Fila.D., BCPS, AAHIVP []  , Pharm.D., BCPS, AAHIVP []  Georgina Pillion, PharmD, BCPS []  , PharmD, BCPS []  [place], PharmD, BCPS []  [place], PharmD []  , PharmD, BCPS []  Estella Husk, PharmD  Pharmacy Team []  Lysle Pearl, PharmD []  , PharmD []  Phillips Climes, PharmD []  , Rph []  Agapito Games) , PharmD []  Verlan Friends, PharmD []  , PharmD []  Mervyn Gay, PharmD []  , PharmD []  Vinnie Level, PharmD []  [facility], PharmD []  , PharmD []  Len Childs, PharmD   Positive urine culture Treated with Cephalexin, organism sensitive to the same and no further patient follow-up is required at this time.  , PharmD  Greer Pickerel Talley 09/04/2020, 10:30 AM

## 2020-09-04 NOTE — Telephone Encounter (Signed)
Post ED Visit - Positive Culture Follow-up  Culture report reviewed by antimicrobial stewardship pharmacist: [facility] []  , Pharm.D. []  Enzo Bi, Pharm.D., BCPS AQ-ID []  , Pharm.D., BCPS []  Celedonio Miyamoto, Pharm.D., BCPS []  [place], Garvin Fila.D., BCPS, AAHIVP []  , Pharm.D., BCPS, AAHIVP []  Georgina Pillion, PharmD, BCPS []  , PharmD, BCPS []  [place], PharmD, BCPS []  [address], PharmD []  , PharmD, BCPS []  Estella Husk, PharmD  Pharmacy Team []  Lysle Pearl, PharmD []  , PharmD []  Phillips Climes, PharmD []  , Rph []  Agapito Games) , PharmD []  Verlan Friends, PharmD []  , PharmD []  Mervyn Gay, PharmD []  , PharmD []  Vinnie Level, PharmD []  [employer], PharmD []  , PharmD []  Len Childs, PharmD   Positive urine culture Treated with Cephalexin, organism sensitive to the same and no further patient follow-up is required at this time.  , PharmD  Greer Pickerel Talley 09/04/2020, 10:29 AM

## 2020-12-21 ENCOUNTER — Other Ambulatory Visit: Payer: Self-pay | Admitting: Family Medicine

## 2020-12-21 DIAGNOSIS — Z1231 Encounter for screening mammogram for malignant neoplasm of breast: Secondary | ICD-10-CM

## 2020-12-26 ENCOUNTER — Ambulatory Visit
Admission: RE | Admit: 2020-12-26 | Discharge: 2020-12-26 | Disposition: A | Discharge status: Home / Self Care | Payer: Medicare Other | Source: Ambulatory Visit | Attending: Family Medicine | Admitting: Family Medicine

## 2020-12-26 DIAGNOSIS — Z1231 Encounter for screening mammogram for malignant neoplasm of breast: Secondary | ICD-10-CM

## 2021-01-01 DIAGNOSIS — R7303 Prediabetes: Secondary | ICD-10-CM | POA: Diagnosis not present

## 2021-01-03 DIAGNOSIS — H547 Unspecified visual loss: Secondary | ICD-10-CM | POA: Diagnosis not present

## 2021-01-03 DIAGNOSIS — Z8669 Personal history of other diseases of the nervous system and sense organs: Secondary | ICD-10-CM | POA: Diagnosis not present

## 2021-01-03 DIAGNOSIS — Z970 Presence of artificial eye: Secondary | ICD-10-CM | POA: Diagnosis not present

## 2021-01-03 DIAGNOSIS — Z0000 Encounter for general adult medical examination without abnormal findings: Secondary | ICD-10-CM | POA: Diagnosis not present

## 2021-01-03 DIAGNOSIS — I10 Essential (primary) hypertension: Secondary | ICD-10-CM | POA: Diagnosis not present

## 2021-01-08 DIAGNOSIS — I10 Essential (primary) hypertension: Secondary | ICD-10-CM | POA: Diagnosis not present

## 2021-01-08 DIAGNOSIS — H547 Unspecified visual loss: Secondary | ICD-10-CM | POA: Diagnosis not present

## 2021-01-08 DIAGNOSIS — R946 Abnormal results of thyroid function studies: Secondary | ICD-10-CM | POA: Diagnosis not present

## 2021-01-08 DIAGNOSIS — Z23 Encounter for immunization: Secondary | ICD-10-CM | POA: Diagnosis not present

## 2021-01-08 DIAGNOSIS — I700 Atherosclerosis of aorta: Secondary | ICD-10-CM | POA: Diagnosis not present

## 2021-01-08 DIAGNOSIS — R7303 Prediabetes: Secondary | ICD-10-CM | POA: Diagnosis not present

## 2021-02-08 DIAGNOSIS — G3184 Mild cognitive impairment, so stated: Secondary | ICD-10-CM | POA: Diagnosis not present

## 2021-02-23 DIAGNOSIS — N200 Calculus of kidney: Secondary | ICD-10-CM | POA: Diagnosis not present

## 2021-02-23 DIAGNOSIS — I700 Atherosclerosis of aorta: Secondary | ICD-10-CM | POA: Diagnosis not present

## 2021-02-23 DIAGNOSIS — R7303 Prediabetes: Secondary | ICD-10-CM | POA: Diagnosis not present

## 2021-02-23 DIAGNOSIS — I10 Essential (primary) hypertension: Secondary | ICD-10-CM | POA: Diagnosis not present

## 2021-04-12 IMAGING — MG DIGITAL SCREENING BILAT W/ TOMO W/ CAD
8 series · 8 of 24 positions shown · non-contrast
Comparison: Previous exam(s).

CLINICAL DATA: Screening.

EXAM:
DIGITAL SCREENING BILATERAL MAMMOGRAM WITH TOMO AND CAD

[R CC synth-2D]
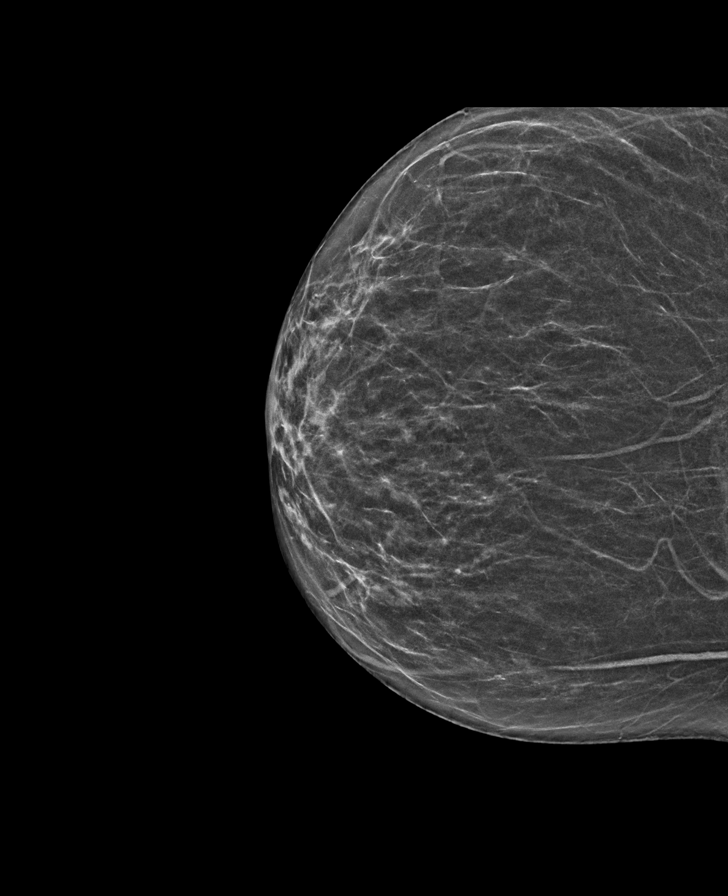

[L MLO synth-2D]
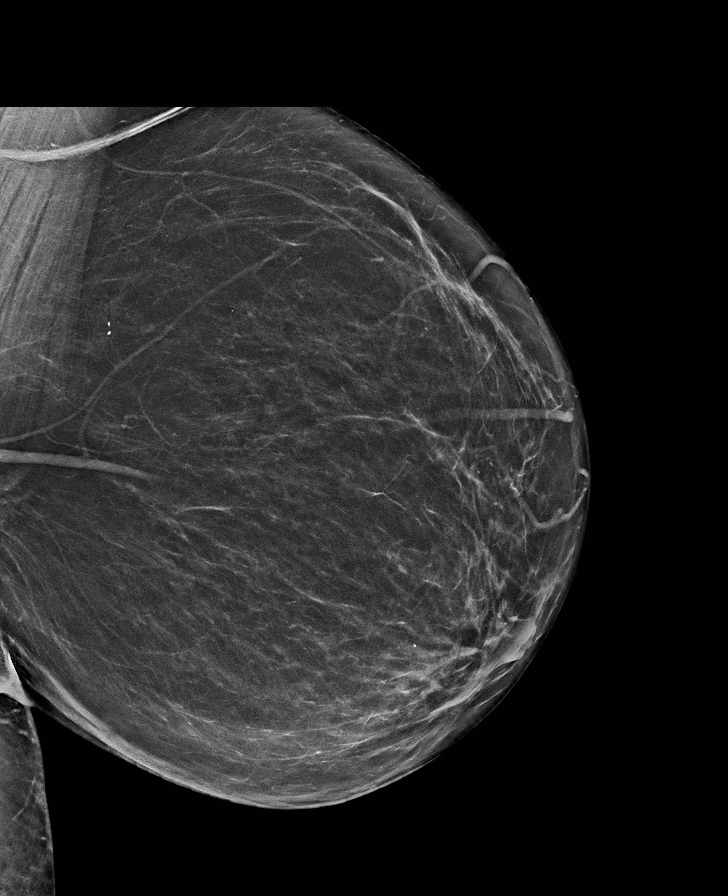

[L CC synth-2D]
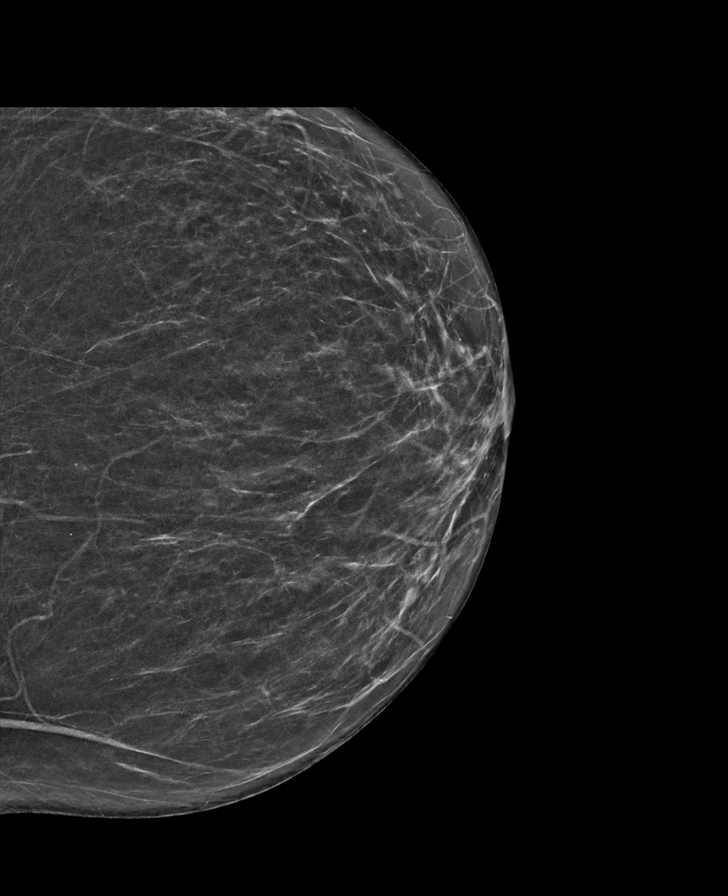

[R MLO synth-2D]
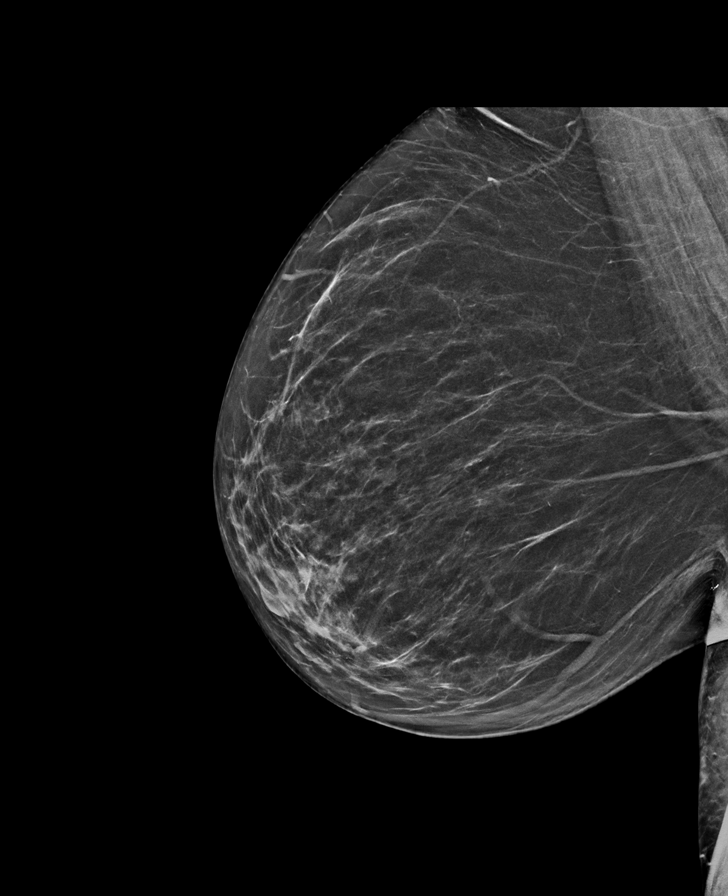

[R CC tomo · tomo slice 29/56.0]
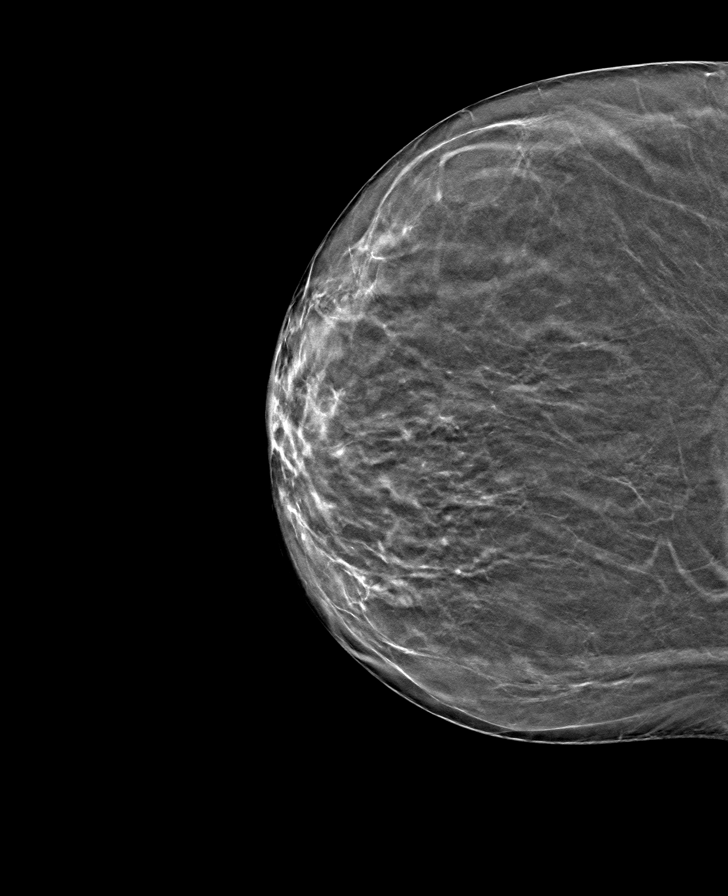

[L MLO tomo · tomo slice 37/72.0]
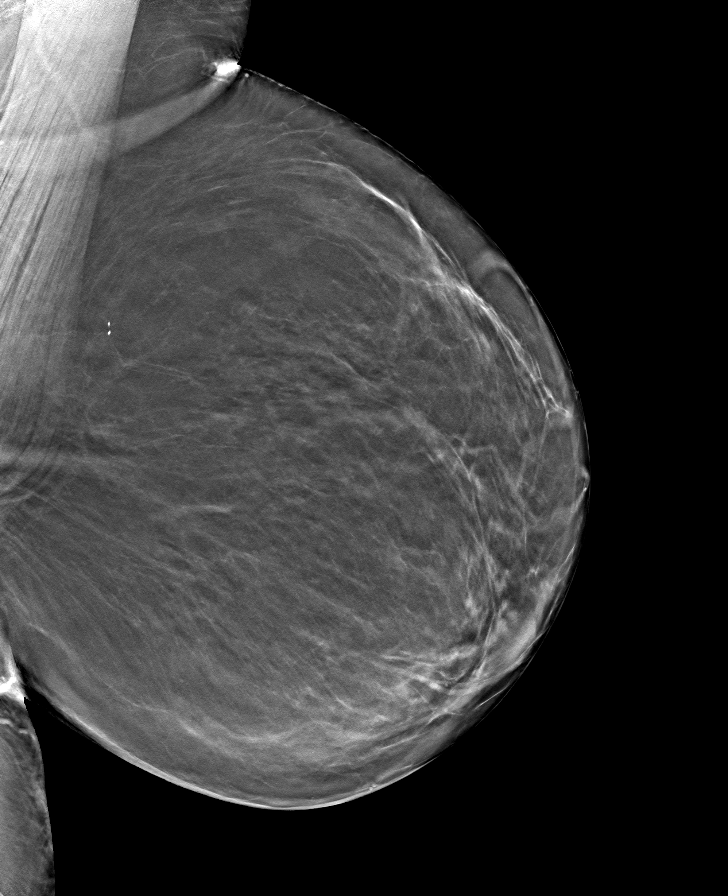

[R MLO tomo · tomo slice 33/64.0]
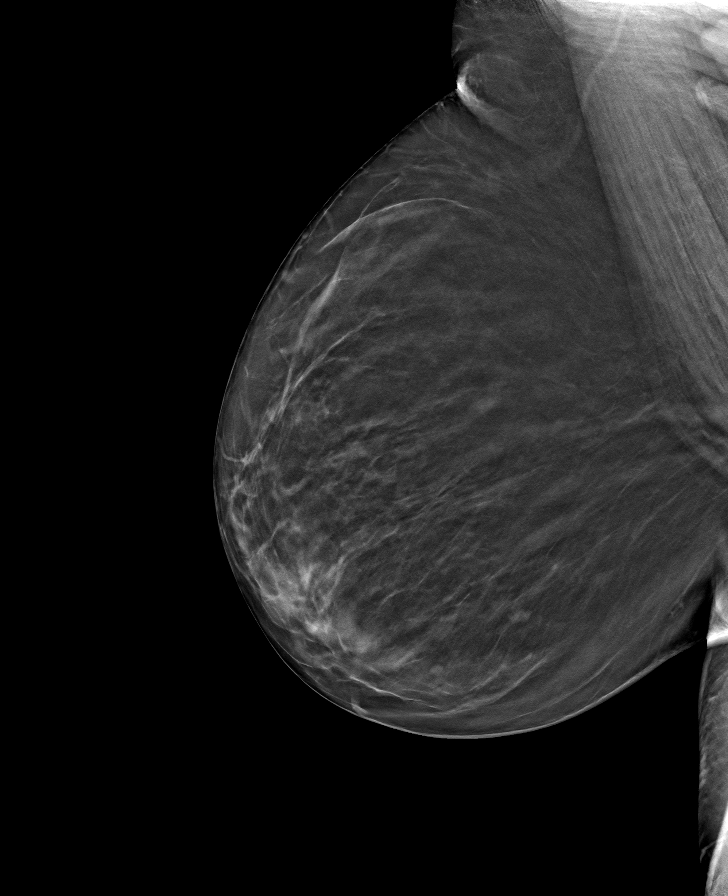

[L CC tomo · tomo slice 31/62.0]
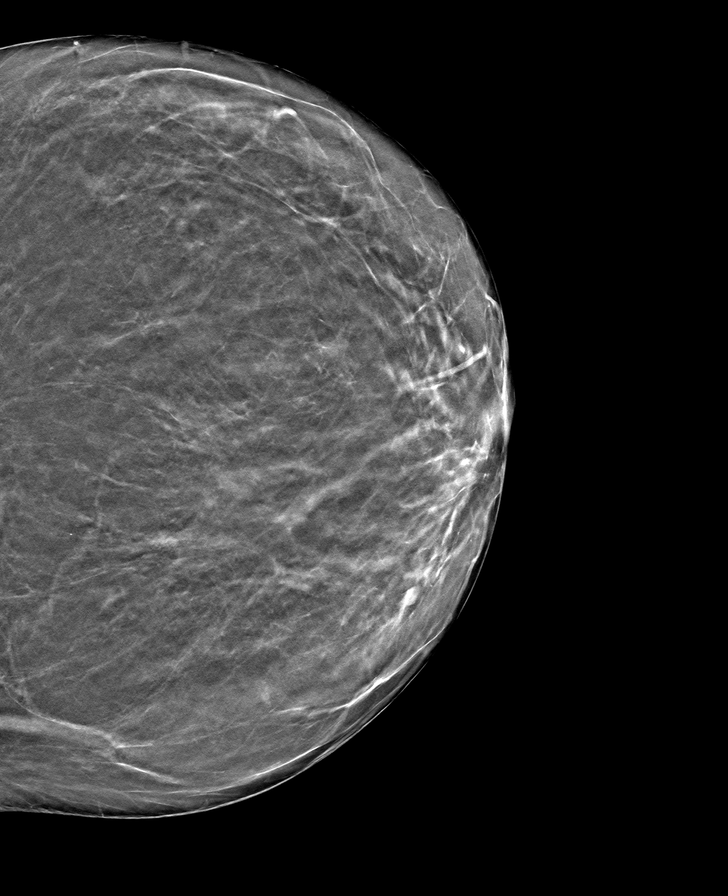

[8 of 24 positions shown; findings below may reference images not displayed]

ACR Breast Density Category b: There are scattered areas of
fibroglandular density.
FINDINGS: There are no findings suspicious for malignancy. Images were
processed with CAD.
IMPRESSION: No mammographic evidence of malignancy. A result letter of this
screening mammogram will be mailed directly to the patient.

RECOMMENDATION:
Screening mammogram in one year. (Code:CN-U-775)

BI-RADS CATEGORY  1: Negative.

## 2021-12-04 DIAGNOSIS — J40 Bronchitis, not specified as acute or chronic: Secondary | ICD-10-CM | POA: Diagnosis not present

## 2021-12-04 DIAGNOSIS — M109 Gout, unspecified: Secondary | ICD-10-CM | POA: Diagnosis not present

## 2022-01-14 DIAGNOSIS — R946 Abnormal results of thyroid function studies: Secondary | ICD-10-CM | POA: Diagnosis not present

## 2022-01-14 DIAGNOSIS — I700 Atherosclerosis of aorta: Secondary | ICD-10-CM | POA: Diagnosis not present

## 2022-01-14 DIAGNOSIS — R062 Wheezing: Secondary | ICD-10-CM | POA: Diagnosis not present

## 2022-01-14 DIAGNOSIS — R7309 Other abnormal glucose: Secondary | ICD-10-CM | POA: Diagnosis not present

## 2022-01-14 DIAGNOSIS — I10 Essential (primary) hypertension: Secondary | ICD-10-CM | POA: Diagnosis not present

## 2022-01-14 DIAGNOSIS — R059 Cough, unspecified: Secondary | ICD-10-CM | POA: Diagnosis not present

## 2022-01-14 DIAGNOSIS — R7303 Prediabetes: Secondary | ICD-10-CM | POA: Diagnosis not present

## 2022-02-14 DIAGNOSIS — I10 Essential (primary) hypertension: Secondary | ICD-10-CM | POA: Diagnosis not present

## 2022-02-14 DIAGNOSIS — H538 Other visual disturbances: Secondary | ICD-10-CM | POA: Diagnosis not present

## 2022-02-14 DIAGNOSIS — G3184 Mild cognitive impairment, so stated: Secondary | ICD-10-CM | POA: Diagnosis not present

## 2022-05-20 ENCOUNTER — Other Ambulatory Visit: Payer: Self-pay | Admitting: Family Medicine

## 2022-05-20 DIAGNOSIS — Z1231 Encounter for screening mammogram for malignant neoplasm of breast: Secondary | ICD-10-CM

## 2022-07-03 ENCOUNTER — Ambulatory Visit
Admission: RE | Admit: 2022-07-03 | Discharge: 2022-07-03 | Disposition: A | Discharge status: Home / Self Care | Payer: Medicare Other | Source: Ambulatory Visit | Attending: Family Medicine | Admitting: Family Medicine

## 2022-07-03 DIAGNOSIS — Z1231 Encounter for screening mammogram for malignant neoplasm of breast: Secondary | ICD-10-CM | POA: Diagnosis not present

## 2022-07-09 DIAGNOSIS — R7303 Prediabetes: Secondary | ICD-10-CM | POA: Diagnosis not present

## 2022-07-09 DIAGNOSIS — I700 Atherosclerosis of aorta: Secondary | ICD-10-CM | POA: Diagnosis not present

## 2022-07-09 DIAGNOSIS — R7309 Other abnormal glucose: Secondary | ICD-10-CM | POA: Diagnosis not present

## 2022-07-09 DIAGNOSIS — I10 Essential (primary) hypertension: Secondary | ICD-10-CM | POA: Diagnosis not present

## 2022-07-09 DIAGNOSIS — R946 Abnormal results of thyroid function studies: Secondary | ICD-10-CM | POA: Diagnosis not present

## 2022-07-16 DIAGNOSIS — H547 Unspecified visual loss: Secondary | ICD-10-CM | POA: Diagnosis not present

## 2022-07-16 DIAGNOSIS — Z0000 Encounter for general adult medical examination without abnormal findings: Secondary | ICD-10-CM | POA: Diagnosis not present

## 2022-07-16 DIAGNOSIS — R7303 Prediabetes: Secondary | ICD-10-CM | POA: Diagnosis not present

## 2022-07-16 DIAGNOSIS — I700 Atherosclerosis of aorta: Secondary | ICD-10-CM | POA: Diagnosis not present

## 2022-07-16 DIAGNOSIS — I10 Essential (primary) hypertension: Secondary | ICD-10-CM | POA: Diagnosis not present

## 2022-07-16 DIAGNOSIS — R946 Abnormal results of thyroid function studies: Secondary | ICD-10-CM | POA: Diagnosis not present

## 2023-01-07 DIAGNOSIS — R946 Abnormal results of thyroid function studies: Secondary | ICD-10-CM | POA: Diagnosis not present

## 2023-01-07 DIAGNOSIS — I700 Atherosclerosis of aorta: Secondary | ICD-10-CM | POA: Diagnosis not present

## 2023-01-07 DIAGNOSIS — I10 Essential (primary) hypertension: Secondary | ICD-10-CM | POA: Diagnosis not present

## 2023-01-07 DIAGNOSIS — R7303 Prediabetes: Secondary | ICD-10-CM | POA: Diagnosis not present

## 2023-01-14 DIAGNOSIS — N200 Calculus of kidney: Secondary | ICD-10-CM | POA: Diagnosis not present

## 2023-01-14 DIAGNOSIS — R062 Wheezing: Secondary | ICD-10-CM | POA: Diagnosis not present

## 2023-01-14 DIAGNOSIS — I129 Hypertensive chronic kidney disease with stage 1 through stage 4 chronic kidney disease, or unspecified chronic kidney disease: Secondary | ICD-10-CM | POA: Diagnosis not present

## 2023-01-14 DIAGNOSIS — Z23 Encounter for immunization: Secondary | ICD-10-CM | POA: Diagnosis not present

## 2023-01-14 DIAGNOSIS — N1831 Chronic kidney disease, stage 3a: Secondary | ICD-10-CM | POA: Diagnosis not present

## 2023-01-14 DIAGNOSIS — R7303 Prediabetes: Secondary | ICD-10-CM | POA: Diagnosis not present

## 2023-01-14 DIAGNOSIS — I700 Atherosclerosis of aorta: Secondary | ICD-10-CM | POA: Diagnosis not present

## 2023-01-14 DIAGNOSIS — H547 Unspecified visual loss: Secondary | ICD-10-CM | POA: Diagnosis not present

## 2023-01-14 DIAGNOSIS — R0609 Other forms of dyspnea: Secondary | ICD-10-CM | POA: Diagnosis not present

## 2023-04-09 ENCOUNTER — Ambulatory Visit: Payer: Medicare Other | Admitting: Internal Medicine

## 2023-04-09 ENCOUNTER — Encounter: Payer: Self-pay | Admitting: Internal Medicine

## 2023-04-09 VITALS — BP 167/85 | HR 82 | Temp 98.1°F | Ht 63.0 in | Wt 207.4 lb

## 2023-04-09 DIAGNOSIS — G4719 Other hypersomnia: Secondary | ICD-10-CM

## 2023-04-09 DIAGNOSIS — R0609 Other forms of dyspnea: Secondary | ICD-10-CM

## 2023-04-09 NOTE — Patient Instructions (Signed)
It was a pleasure to see you today!  Please schedule follow up scheduled with myself in 3 months.  If my schedule is not open yet, we will contact you with a reminder closer to that time. Please call [phone] if you haven't heard from [place] a month before, and always call us sooner if issues or concerns arise. You can also send [place] a message through MyChart, but but aware that this is not to be used for urgent issues and it may take up to 5-7 days to receive a reply. Please be aware that you will likely be able to view your results before I have a chance to respond to them. Please give [place] 5 business days to respond to any non-urgent results.    Before your next visit I would like you to have: Home sleep test - I will order this and they will contact you for next steps.  Take the albuterol rescue inhaler every 4 to 6 hours as needed for wheezing or shortness of breath. You can also take it 15 minutes before exercise or exertional activity. Side effects include heart racing or pounding, jitters or anxiety. If you have a history of an irregular heart rhythm, it can make this worse. Can also give some patients a hard time sleeping.

## 2023-04-09 NOTE — Progress Notes (Signed)
Jaclyn Donovan    [id number]    1945-11-25  Primary Care Physician:Collins, Annabelle Harman, DO  Referring Physician: Irena Reichmann, DO [address] [place],  [place] [address] Reason for Consultation: shortness of breath Date of Consultation: 04/09/2023  Chief complaint:   Chief Complaint  Patient presents with   Follow-up    S.o.b- wheezing, gasping for air, winded x1 yr      HPI: Jaclyn Donovan is a 78 y.o. woman with blindness who presents or new patient evaluation for coughing, wheezing, and shortness of breath for about the past year.   Symptoms are usually with exertion. Cough is dry and usually occurs with sleep - wakes her up. Denies heart burn. Denies post nasal drainage.   She has tried albuterol as needed for the cough. Use is less than once/month. Has not tried albuterol for the wheezing or shortness of breath.   Never had pneumonia or bronchitis.  No childhood respiratory disease.  She has had covid positive a couple of home tests due to coughing. Never bad enough to be hospitalized.   Epworth Sleepiness Scale  Chance of dozing off while sitting and reading? 0  1  2  3   4   2.   Chance of dozing off while watching TV?  0  1  2  3   4   3.   Chance of dozing off while Sitting, inactive in a public place?  0  1  2  3   4   4.   Chance of dozing off as a passenger in a car for an hour without a break?  0  1  2  3   4   5.   Chance of dozing off while lying down to rest in the afternoon when circumstances permit?  0  1  2  3   4   6.   Chance of dozing off while sitting and talking to someone?  0  1  2  3   4   7.   Chance of dozing off while sitting quietly after lunch without alcohol?  0  1  2  3   4   8.   Chance of dozing off while in a car, while stopped for a few minutes in traffic? 0  1  2  3   4     Total ESS 15/24   Social history:  Occupation: worked [employer] and [employer] people for 41 years. Did  manufacturing such as brooms, ink pens, jackets.  Exposures: lives with her daughter.  Smoking history: never smoker, no passive smoke exposure.   Social History   Occupational History   Not on file  Tobacco Use   Smoking status: Never   Smokeless tobacco: Never  Vaping Use   Vaping status: Never Used  Substance and Sexual Activity   Alcohol use: No   Drug use: No   Sexual activity: Not on file    Relevant family history:  Family History  Problem Relation Age of Onset   Breast cancer Neg Hx     Past Medical History:  Diagnosis Date   Blind    Hypertension    Polio     Past Surgical History:  Procedure Laterality Date   EYE SURGERY       Physical Exam: Blood pressure (!) 167/85, pulse 82, temperature 98.1 F (36.7 C), temperature source Oral, height 5\' 3"  (1.6 m), weight  207 lb 6.4 oz (94.1 kg), SpO2 98%. Gen:      No acute distress ENT:  mallampati IV, no nasal polyps, mucus membranes moist Lungs:    No increased respiratory effort, symmetric chest wall excursion, clear to auscultation bilaterally, no wheezes or crackles CV:         Regular rate and rhythm; no murmurs, rubs, or gallops.  No pedal edema Abd:      + bowel sounds;, non-tender; obese, soft MSK: no acute synovitis of DIP or PIP joints, no mechanics hands.  Skin:      Warm and dry; no rashes Neuro: normal speech, no focal facial asymmetry Psych: alert and oriented x3, normal mood and affect   Data Reviewed/Medical Decision Making:  Independent interpretation of tests: Imaging:  Review of patient's chest xray July 2022 images revealed no acute process, bibasilar atelectasis. The patient's images have been independently reviewed by me.    PFTs:    Labs:  Lab Results  Component Value Date   NA 136 09/01/2020   K 4.3 09/01/2020   CO2 29 09/01/2020   GLUCOSE 106 (H) 09/01/2020   BUN 23 09/01/2020   CREATININE 1.36 (H) 09/01/2020   CALCIUM 9.4 09/01/2020   GFRNONAA 41 (L) 09/01/2020    Lab Results  Component Value Date   WBC 6.8 09/01/2020   HGB 13.1 09/01/2020   HCT 39.4 09/01/2020   MCV 91.4 09/01/2020   PLT 265 09/01/2020     Immunization status:   There is no immunization history on file for this patient.   I reviewed prior external note(s) from ED  I reviewed the result(s) of the labs and imaging as noted above.   I have ordered home sleep test   Assessment:  Chronic Cough Shortness of breath, wheezing Excessive daytime sleepiness.   Plan/Recommendations:  Home sleep test - I will order this and they will contact you for next steps. Discussed risks of untreated sleep apnea.  Cannot exclude asthma.  Take the albuterol rescue inhaler every 4 to 6 hours as needed for wheezing or shortness of breath.   Return to Care: Return in about 3 months (around 07/07/2023).  Durel Salts, MD Pulmonary and Critical Care Medicine [facility] Office:[phone]  CC: Irena Reichmann, DO

## 2023-06-02 ENCOUNTER — Ambulatory Visit: Admitting: Adult Health

## 2023-06-02 DIAGNOSIS — G4719 Other hypersomnia: Secondary | ICD-10-CM

## 2023-06-02 DIAGNOSIS — G4730 Sleep apnea, unspecified: Secondary | ICD-10-CM | POA: Diagnosis not present

## 2023-06-16 ENCOUNTER — Other Ambulatory Visit: Payer: Self-pay | Admitting: Internal Medicine

## 2023-06-16 ENCOUNTER — Telehealth: Payer: Self-pay | Admitting: Pulmonary Disease

## 2023-06-16 ENCOUNTER — Encounter: Payer: Self-pay | Admitting: Internal Medicine

## 2023-06-16 DIAGNOSIS — G4733 Obstructive sleep apnea (adult) (pediatric): Secondary | ICD-10-CM | POA: Diagnosis not present

## 2023-06-16 NOTE — Telephone Encounter (Signed)
 ATC x1 Daughter Andrea's cell LVM for [name] to call our office back .

## 2023-06-16 NOTE — Telephone Encounter (Signed)
 Call patient  Sleep study result  Date of study: 06/02/2023  Impression: Severe obstructive sleep apnea with severe oxygen desaturations.  AHI of 50.1, oxygen nadir of 70%  Recommendation: DME referral for CPAP therapy  Auto CPAP 5-20 with heated humidification with patient's mask of choice  An in lab titration study may also be considered as an option of treatment/further evaluation  Encourage weight loss measures  Close clinical follow-up with compliance monitoring to optimize treatment

## 2023-06-19 ENCOUNTER — Other Ambulatory Visit: Payer: Self-pay | Admitting: *Deleted

## 2023-06-19 DIAGNOSIS — G4733 Obstructive sleep apnea (adult) (pediatric): Secondary | ICD-10-CM

## 2023-06-19 NOTE — Progress Notes (Signed)
 Order placed for new CPAP start.  Nothing further needed.

## 2023-06-25 NOTE — Telephone Encounter (Signed)
 Copied from CRM [id number]. Topic: General - Other >> Jun 25, 2023  2:14 PM Eveleen Hinds B wrote: Reason for CRM: Patient daughter [name] returning call from last week from [facility] regarding results. Please call [phone].

## 2023-06-30 NOTE — Telephone Encounter (Signed)
 ATC x2 LVM for patient's daughter Jaclyn Donovan to call our office back .

## 2023-07-14 DIAGNOSIS — G4730 Sleep apnea, unspecified: Secondary | ICD-10-CM | POA: Diagnosis not present

## 2023-07-14 DIAGNOSIS — I129 Hypertensive chronic kidney disease with stage 1 through stage 4 chronic kidney disease, or unspecified chronic kidney disease: Secondary | ICD-10-CM | POA: Diagnosis not present

## 2023-07-14 DIAGNOSIS — R7303 Prediabetes: Secondary | ICD-10-CM | POA: Diagnosis not present

## 2023-07-14 DIAGNOSIS — R062 Wheezing: Secondary | ICD-10-CM | POA: Diagnosis not present

## 2023-07-14 DIAGNOSIS — I700 Atherosclerosis of aorta: Secondary | ICD-10-CM | POA: Diagnosis not present

## 2023-07-14 DIAGNOSIS — N1831 Chronic kidney disease, stage 3a: Secondary | ICD-10-CM | POA: Diagnosis not present

## 2023-07-14 DIAGNOSIS — H547 Unspecified visual loss: Secondary | ICD-10-CM | POA: Diagnosis not present

## 2023-07-14 DIAGNOSIS — Z0000 Encounter for general adult medical examination without abnormal findings: Secondary | ICD-10-CM | POA: Diagnosis not present
